# Patient Record
Sex: Male | Born: 1965 | Race: Black or African American | Hispanic: No | Marital: Married | Smoking: Former smoker
Health system: Southern US, Community
[De-identification: ages and names within clinical notes are randomized; demographics above are authoritative.]

## PROBLEM LIST (undated history)

## (undated) DIAGNOSIS — T07XXXA Unspecified multiple injuries, initial encounter: Secondary | ICD-10-CM

## (undated) DIAGNOSIS — K635 Polyp of colon: Secondary | ICD-10-CM

## (undated) HISTORY — DX: Polyp of colon: K63.5

## (undated) HISTORY — DX: Unspecified multiple injuries, initial encounter: T07.XXXA

## (undated) HISTORY — PX: POLYPECTOMY: SHX149

---

## 1998-08-30 ENCOUNTER — Encounter: Admission: RE | Admit: 1998-08-30 | Discharge: 1998-11-28 | Payer: Self-pay | Admitting: Family Medicine

## 1999-08-30 ENCOUNTER — Encounter (INDEPENDENT_AMBULATORY_CARE_PROVIDER_SITE_OTHER): Payer: Self-pay | Admitting: Specialist

## 1999-08-30 ENCOUNTER — Ambulatory Visit (HOSPITAL_COMMUNITY): Admission: RE | Admit: 1999-08-30 | Discharge: 1999-08-30 | Payer: Self-pay | Admitting: Gastroenterology

## 2000-01-02 ENCOUNTER — Emergency Department (HOSPITAL_COMMUNITY): Admission: EM | Admit: 2000-01-02 | Discharge: 2000-01-02 | Payer: Self-pay | Admitting: Emergency Medicine

## 2001-12-11 ENCOUNTER — Ambulatory Visit: Admission: RE | Admit: 2001-12-11 | Discharge: 2001-12-11 | Payer: Self-pay | Admitting: *Deleted

## 2009-03-30 ENCOUNTER — Encounter: Admission: RE | Admit: 2009-03-30 | Discharge: 2009-03-30 | Payer: Self-pay | Admitting: Family Medicine

## 2009-10-03 IMAGING — CR DG CERVICAL SPINE COMPLETE 4+V
6 series · 6 of 6 positions shown · non-contrast
Comparison: None available.

CLINICAL DATA: 42-year-old male with neck stiffness for 2 months.
No known trauma.

CERVICAL SPINE - COMPLETE 4+ VIEW

[view not recorded (1 of 6)]
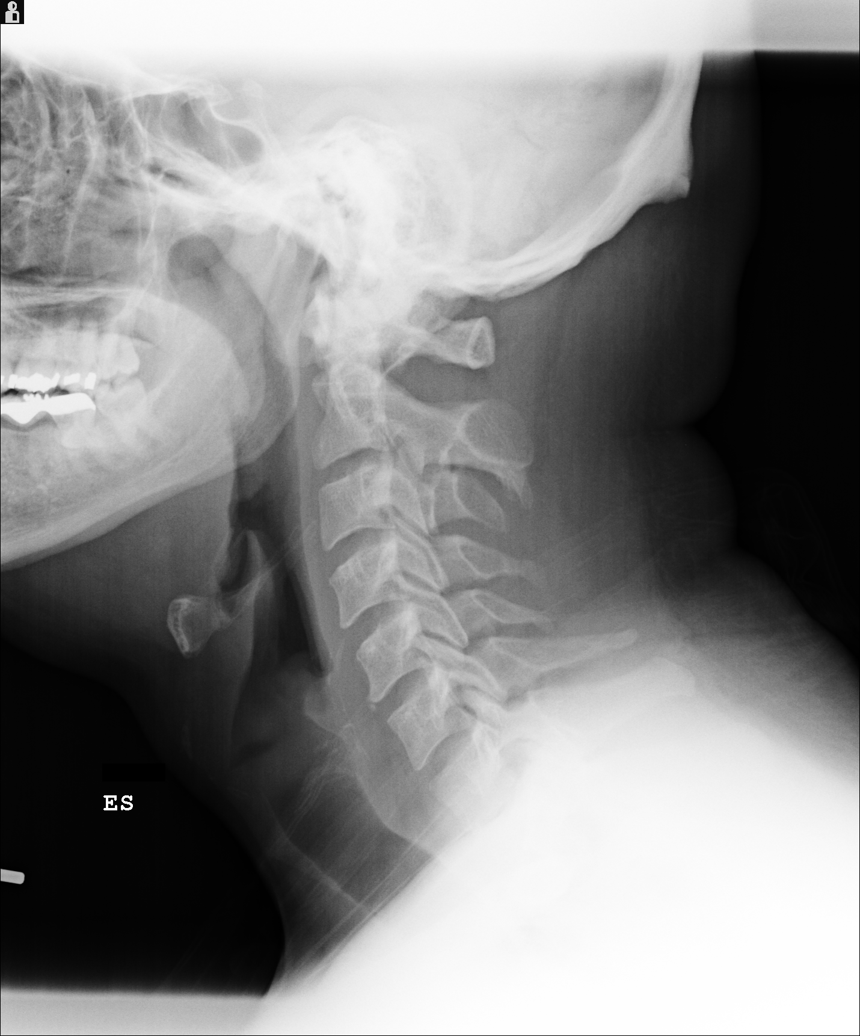

[view not recorded (2 of 6)]
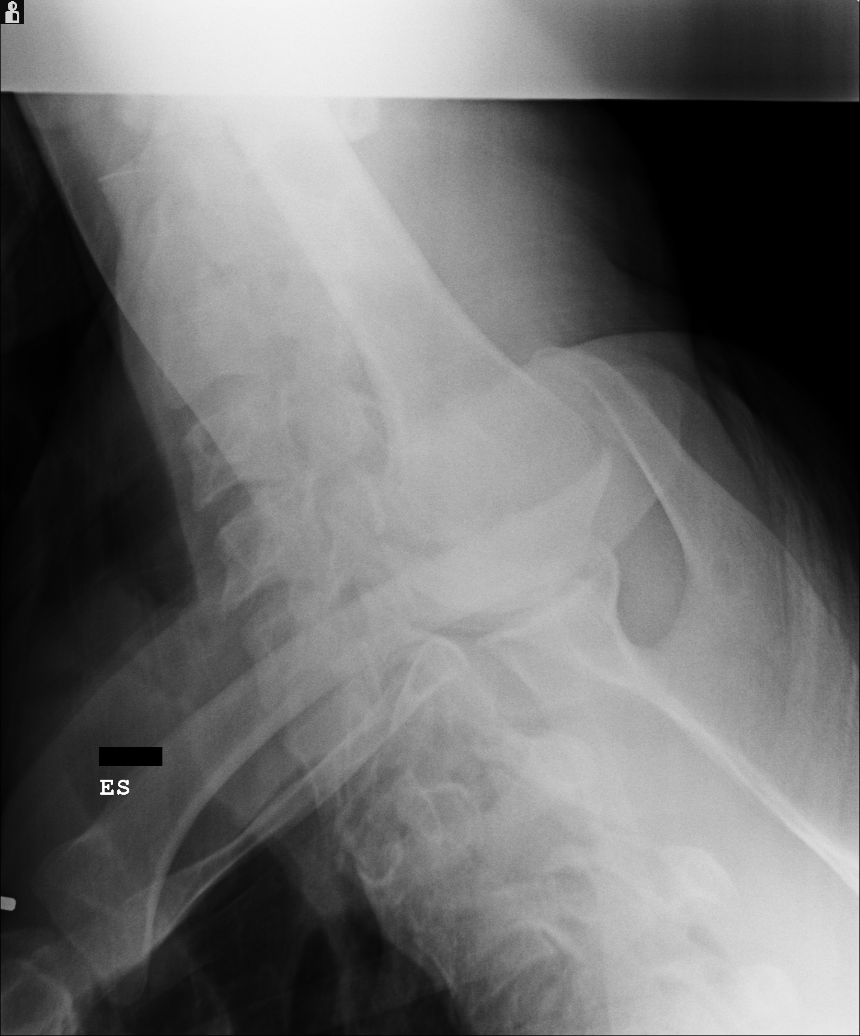

[view not recorded (3 of 6)]
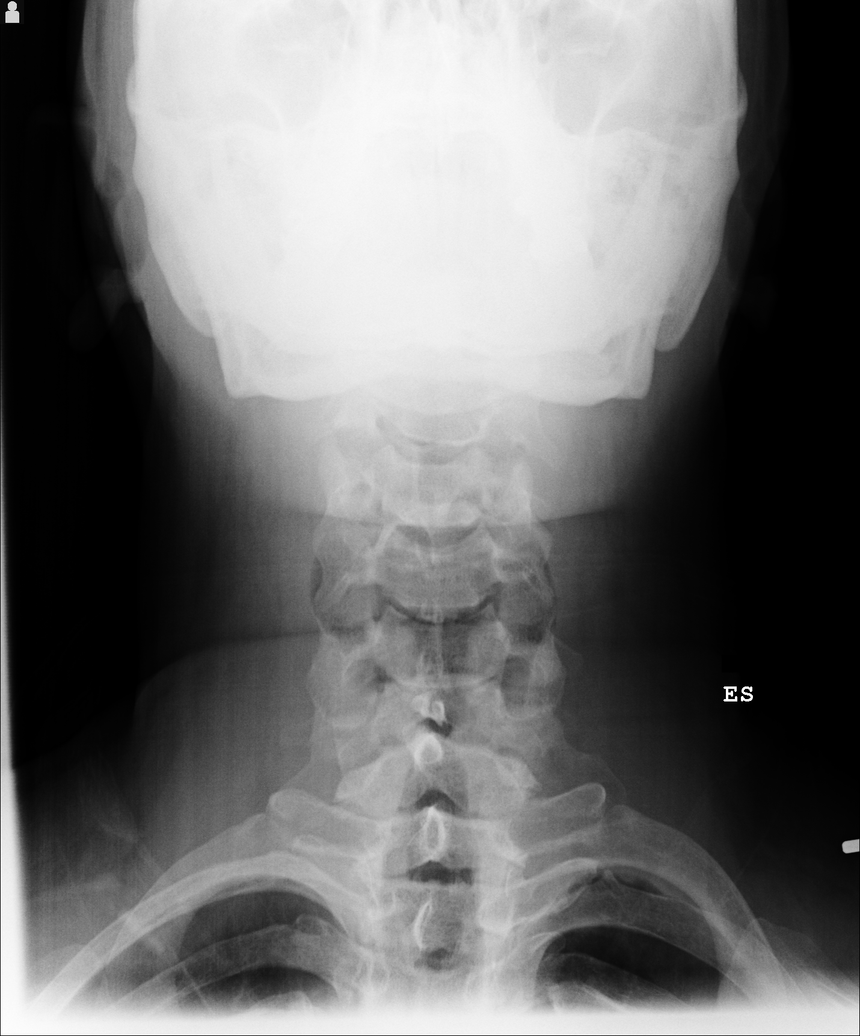

[view not recorded (4 of 6)]
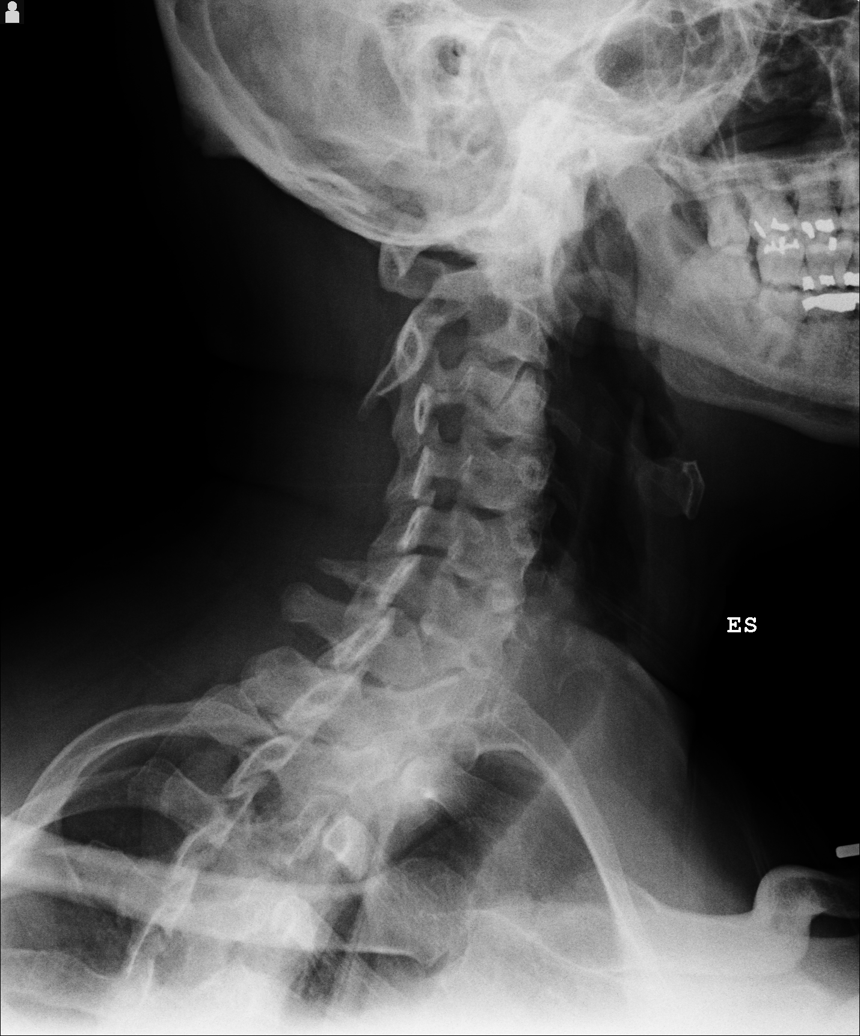

[view not recorded (5 of 6)]
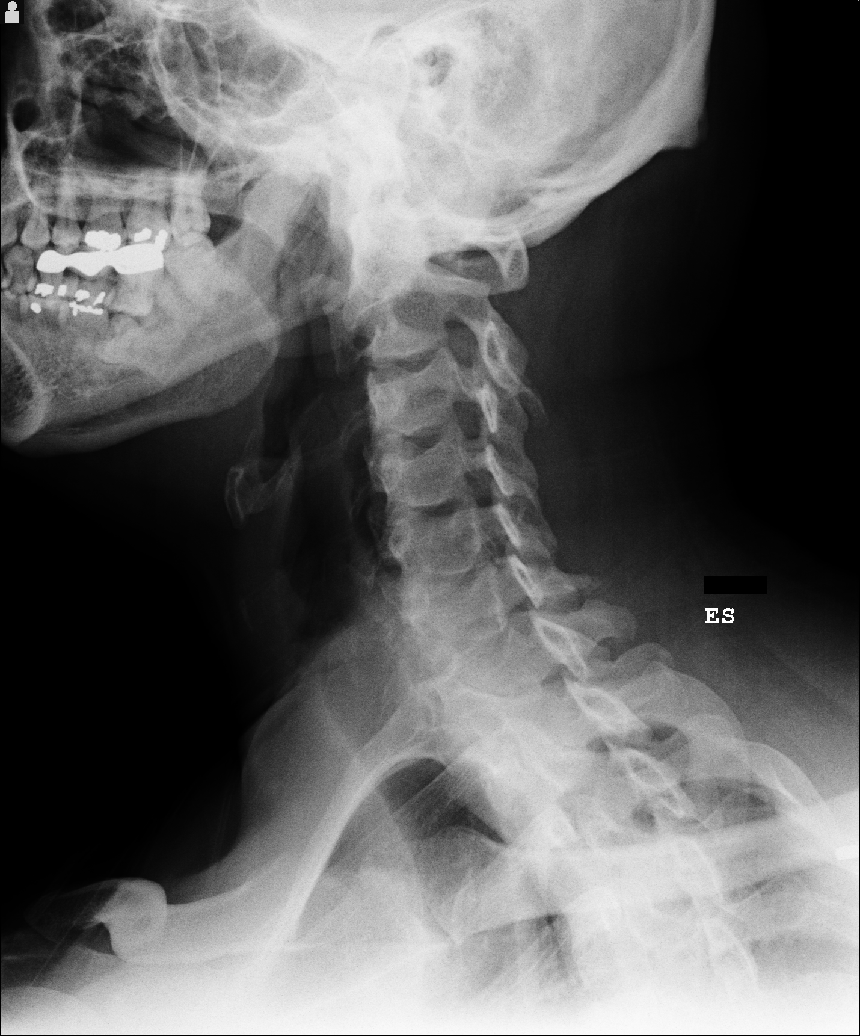

[view not recorded (6 of 6)]
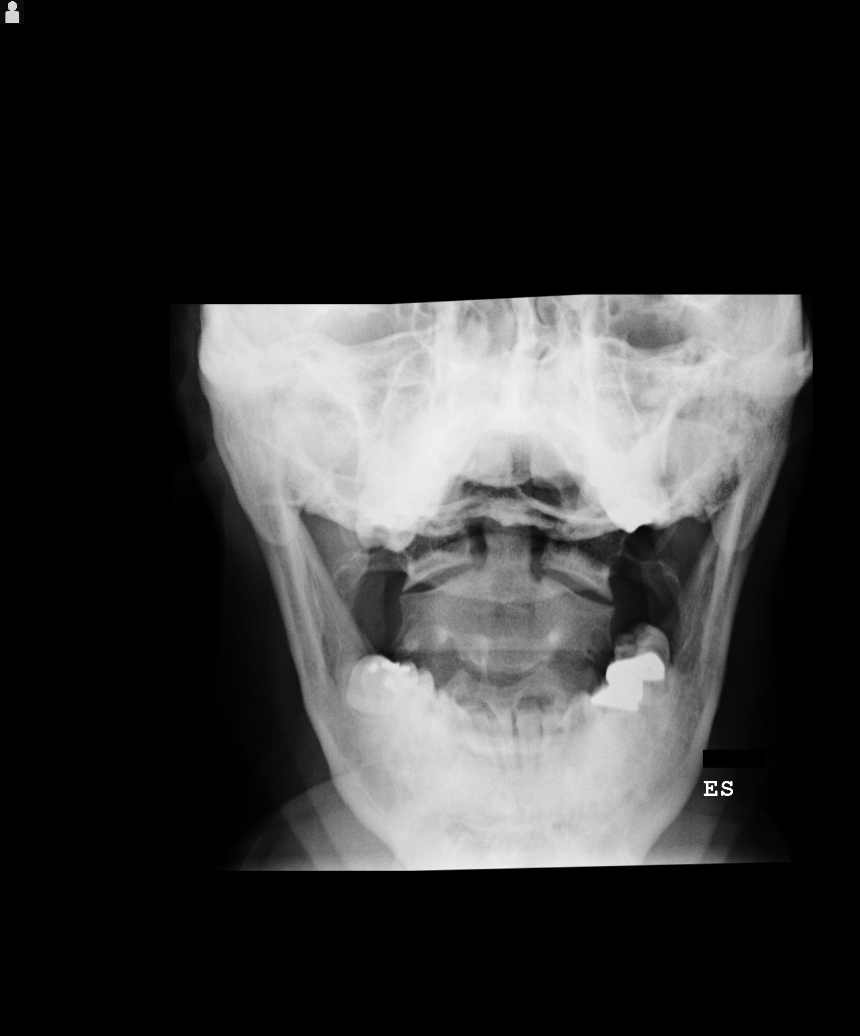

[6 of 6 positions shown; findings below may reference images not displayed]

FINDINGS: Preserved cervical lordosis.  Normal prevertebral soft
tissues. Cervicothoracic junction alignment is within normal
limits.  Relatively preserved disc spaces.  Small discontinuous
osteophyte anteriorly at C5-C6. Bilateral posterior element
alignment is within normal limits.  No significant osseous neural
foraminal stenosis.  AP alignment, C1-C2 alignment, and odontoid
process are within normal limits.
IMPRESSION: Negative radiographic appearance of the cervical spine for age.

## 2016-04-23 DIAGNOSIS — H40022 Open angle with borderline findings, high risk, left eye: Secondary | ICD-10-CM | POA: Diagnosis not present

## 2016-05-13 DIAGNOSIS — I351 Nonrheumatic aortic (valve) insufficiency: Secondary | ICD-10-CM | POA: Diagnosis not present

## 2016-05-13 DIAGNOSIS — I119 Hypertensive heart disease without heart failure: Secondary | ICD-10-CM | POA: Diagnosis not present

## 2016-05-30 DIAGNOSIS — N5201 Erectile dysfunction due to arterial insufficiency: Secondary | ICD-10-CM | POA: Diagnosis not present

## 2016-05-30 DIAGNOSIS — E291 Testicular hypofunction: Secondary | ICD-10-CM | POA: Diagnosis not present

## 2016-05-30 DIAGNOSIS — Z125 Encounter for screening for malignant neoplasm of prostate: Secondary | ICD-10-CM | POA: Diagnosis not present

## 2016-09-09 DIAGNOSIS — J069 Acute upper respiratory infection, unspecified: Secondary | ICD-10-CM | POA: Diagnosis not present

## 2017-01-22 DIAGNOSIS — J069 Acute upper respiratory infection, unspecified: Secondary | ICD-10-CM | POA: Diagnosis not present

## 2017-01-22 DIAGNOSIS — L309 Dermatitis, unspecified: Secondary | ICD-10-CM | POA: Diagnosis not present

## 2017-07-02 DIAGNOSIS — Z0000 Encounter for general adult medical examination without abnormal findings: Secondary | ICD-10-CM | POA: Diagnosis not present

## 2017-07-02 DIAGNOSIS — R739 Hyperglycemia, unspecified: Secondary | ICD-10-CM | POA: Diagnosis not present

## 2017-07-02 DIAGNOSIS — I10 Essential (primary) hypertension: Secondary | ICD-10-CM | POA: Diagnosis not present

## 2017-07-16 DIAGNOSIS — E291 Testicular hypofunction: Secondary | ICD-10-CM | POA: Diagnosis not present

## 2017-07-16 DIAGNOSIS — Z125 Encounter for screening for malignant neoplasm of prostate: Secondary | ICD-10-CM | POA: Diagnosis not present

## 2017-07-22 DIAGNOSIS — N5201 Erectile dysfunction due to arterial insufficiency: Secondary | ICD-10-CM | POA: Diagnosis not present

## 2017-07-22 DIAGNOSIS — E291 Testicular hypofunction: Secondary | ICD-10-CM | POA: Diagnosis not present

## 2017-10-05 DIAGNOSIS — R7989 Other specified abnormal findings of blood chemistry: Secondary | ICD-10-CM | POA: Diagnosis not present

## 2017-10-05 DIAGNOSIS — D229 Melanocytic nevi, unspecified: Secondary | ICD-10-CM | POA: Diagnosis not present

## 2017-10-05 DIAGNOSIS — Z6831 Body mass index (BMI) 31.0-31.9, adult: Secondary | ICD-10-CM | POA: Diagnosis not present

## 2017-10-05 DIAGNOSIS — E663 Overweight: Secondary | ICD-10-CM | POA: Diagnosis not present

## 2017-10-05 DIAGNOSIS — I10 Essential (primary) hypertension: Secondary | ICD-10-CM | POA: Diagnosis not present

## 2017-10-05 DIAGNOSIS — R011 Cardiac murmur, unspecified: Secondary | ICD-10-CM | POA: Diagnosis not present

## 2017-10-20 DIAGNOSIS — I351 Nonrheumatic aortic (valve) insufficiency: Secondary | ICD-10-CM | POA: Diagnosis not present

## 2017-10-20 DIAGNOSIS — I119 Hypertensive heart disease without heart failure: Secondary | ICD-10-CM | POA: Diagnosis not present

## 2017-12-01 ENCOUNTER — Telehealth: Payer: Self-pay | Admitting: *Deleted

## 2017-12-01 NOTE — Telephone Encounter (Signed)
LMVM  Home # for pt to cancel.  Will call back to r/s.

## 2017-12-02 ENCOUNTER — Ambulatory Visit: Payer: BLUE CROSS/BLUE SHIELD | Admitting: Neurology

## 2017-12-02 NOTE — Telephone Encounter (Signed)
Pt called back to r/s appt.  1st available is 3/28 and added to wait list. I did schedule this with him but the pt is very upset that he is having to wait again to be seen. I told him I would send a msg to Rn to try to get him in sooner.  FYI

## 2017-12-08 ENCOUNTER — Encounter: Payer: Self-pay | Admitting: *Deleted

## 2017-12-08 ENCOUNTER — Ambulatory Visit: Payer: BLUE CROSS/BLUE SHIELD | Admitting: Diagnostic Neuroimaging

## 2017-12-08 DIAGNOSIS — G500 Trigeminal neuralgia: Secondary | ICD-10-CM

## 2017-12-08 NOTE — Progress Notes (Signed)
[facility]  PATIENT: Tommy Doyle DOB: 10-22-1966  REFERRING CLINICIAN: Karna Dupes, MD HISTORY FROM: patient and chart review  REASON FOR VISIT: new consult    HISTORICAL  CHIEF COMPLAINT:  Chief Complaint  Patient presents with  . Trigeminal neuralgia    rm 7, New Pt, "bottom teeth aching with shooting pain in left lower jaw, dentist/ENT found no issues, moved to top of jaw;  has improved over time    HISTORY OF PRESENT ILLNESS:   52 year old male here for evaluation of left facial pain.  November 2018 patient had onset of intermittent sharp shooting electrical pain in his left lower jaw and neck region.  This lasted for a few seconds at a time.  This is typically triggered by hot liquids.  Over time this increased to lasting for a few minutes at a time.  He saw a dentist, endodontist, ENT, who found no specific pathology.  Patient tried ibuprofen and naproxen without relief.  By the end of December 2018 symptoms had gradually improved and resolved.  Currently patient has no residual symptoms.  No specific triggering or prodromal factors.  No similar symptoms in the past.  No numbness or tingling in his arms or legs.  No balance difficulty.  No swallowing or speech difficulty.  No vision changes.   REVIEW OF SYSTEMS: Full 14 system review of systems performed and negative with exception of: Sleepiness feeling cold history of hypertension and enlarged heart.  ALLERGIES: Allergies  Allergen Reactions  . Augmentin [Amoxicillin-Pot Clavulanate] Diarrhea    HOME MEDICATIONS: Outpatient Medications Prior to Visit  Medication Sig Dispense Refill  . amLODipine (NORVASC) 5 MG tablet Take 5 mg by mouth daily.    . carvedilol (COREG) 12.5 MG tablet Take 12.5 mg by mouth 2 (two) times daily with a meal.    . ibuprofen (ADVIL,MOTRIN) 200 MG tablet Take 200 mg by mouth every 6 (six) hours as needed.    Marland Kitchen losartan-hydrochlorothiazide (HYZAAR) 100-25 MG tablet Take  1 tablet by mouth daily.    . prednisoLONE acetate (PRED FORTE) 1 % ophthalmic suspension 1 drop 2 (two) times daily.    Marland Kitchen testosterone cypionate (DEPOTESTOSTERONE CYPIONATE) 200 MG/ML injection Inject 200 mg into the muscle every 14 (fourteen) days.    Marland Kitchen triamcinolone cream (KENALOG) 0.1 % Apply 1 application topically 2 (two) times daily.    . valACYclovir (VALTREX) 500 MG tablet Take 500 mg by mouth 2 (two) times daily. As needed    . naproxen (NAPROSYN) 500 MG tablet Take 500 mg by mouth 2 (two) times daily with a meal.    . sildenafil (REVATIO) 20 MG tablet Take 20 mg by mouth 3 (three) times daily.     No facility-administered medications prior to visit.     PAST MEDICAL HISTORY: Past Medical History:  Diagnosis Date  . Colon polyps   . Fractures    hx bilateral femur fx, age 31's    PAST SURGICAL HISTORY: Past Surgical History:  Procedure Laterality Date  . POLYPECTOMY      FAMILY HISTORY: Family History  Problem Relation Age of Onset  . Hypertension Mother   . Cancer Mother        gallbladder  . Stroke Mother   . Hypertension Father   . Diabetes Father   . Hypertension Brother     SOCIAL HISTORY:  Social History   Socioeconomic History  . Marital status: Married    Spouse name: Not on file  . Number  of children: 2  . Years of education: masters  . Highest education level: Not on file  Social Needs  . Financial resource strain: Not on file  . Food insecurity - worry: Not on file  . Food insecurity - inability: Not on file  . Transportation needs - medical: Not on file  . Transportation needs - non-medical: Not on file  Occupational History    Comment: engineering [employer]  Tobacco Use  . Smoking status: Former Games developermoker  . Smokeless tobacco: Never Used  . Tobacco comment: smoked  in college  Substance and Sexual Activity  . Alcohol use: Yes    Comment: 5 drinks/week  . Drug use: No  . Sexual activity: Not on file  Other Topics Concern  . Not on file    Social History Narrative   Lives with wife   Caffeine- coffee, 2 cups     PHYSICAL EXAM  GENERAL EXAM/CONSTITUTIONAL: Vitals:  Vitals:   12/08/17 1446  BP: (!) 148/83  Pulse: 74  Weight: 262 lb 6.4 oz (119 kg)  Height: 6' 4.5" (1.943 m)     Body mass index is 31.52 kg/m.  Visual Acuity Screening   Right eye Left eye Both eyes  Without correction: 20/20 20/20   With correction:        Patient is in no distress; well developed, nourished and groomed; neck is supple  CARDIOVASCULAR:  Examination of carotid arteries is normal; no carotid bruits  Regular rate and rhythm, no murmurs  Examination of peripheral vascular system by observation and palpation is normal  EYES:  Ophthalmoscopic exam of optic discs and posterior segments is normal; no papilledema or hemorrhages  MUSCULOSKELETAL:  Gait, strength, tone, movements noted in Neurologic exam below  NEUROLOGIC: MENTAL STATUS:  No flowsheet data found.  awake, alert, oriented to person, place and time  recent and remote memory intact  normal attention and concentration  language fluent, comprehension intact, naming intact,   fund of knowledge appropriate  CRANIAL NERVE:   2nd - no papilledema on fundoscopic exam  2nd, 3rd, 4th, 6th - pupils equal and reactive to light, visual fields full to confrontation, extraocular muscles intact, no nystagmus  5th - facial sensation symmetric  7th - facial strength symmetric  8th - hearing intact  9th - palate elevates symmetrically, uvula midline  11th - shoulder shrug symmetric  12th - tongue protrusion midline  MOTOR:   normal bulk and tone, full strength in the BUE, BLE  SENSORY:   normal and symmetric to light touch, temperature, vibration  COORDINATION:   finger-nose-finger, fine finger movements normal  REFLEXES:   deep tendon reflexes present and symmetric  GAIT/STATION:   narrow based gait    DIAGNOSTIC DATA (LABS, IMAGING,  TESTING) - I reviewed patient records, labs, notes, testing and imaging myself where available.  No results found for: WBC, HGB, HCT, MCV, PLT No results found for: NA, K, CL, CO2, GLUCOSE, BUN, CREATININE, CALCIUM, PROT, ALBUMIN, AST, ALT, ALKPHOS, BILITOT, GFRNONAA, GFRAA No results found for: CHOL, HDL, LDLCALC, LDLDIRECT, TRIG, CHOLHDL No results found for: WGNF6OHGBA1C No results found for: VITAMINB12 No results found for: TSH      ASSESSMENT AND PLAN  52 y.o. year old male here with intermittent sharp shooting pain in left trigeminal nerve distribution (V2, V3) in November and December 2018.  Symptoms spontaneously resolved.  Could represent idiopathic trigeminal neuralgia.  Will check MRI of the brain and trigeminal nerve protocol to rule out other secondary causes.  Dx:  1. Trigeminal neuralgia      PLAN:  Orders Placed This Encounter  Procedures  . MR FACE/TRIGEMINAL WO/W CM  . MR BRAIN W WO CONTRAST   Return in about 6 months (around 06/07/2018).    Suanne Marker, MD 12/08/2017, 3:04 PM Certified in Neurology, Neurophysiology and Neuroimaging  [facility] [address], [address] [place], [place] [address] [phone]

## 2017-12-08 NOTE — Patient Instructions (Signed)
-   check MRI brain   - monitor symptoms 

## 2018-01-28 ENCOUNTER — Ambulatory Visit: Payer: Self-pay | Admitting: Neurology

## 2018-05-20 ENCOUNTER — Other Ambulatory Visit: Payer: Self-pay | Admitting: Gastroenterology

## 2018-05-20 DIAGNOSIS — Z800 Family history of malignant neoplasm of digestive organs: Secondary | ICD-10-CM

## 2018-05-20 DIAGNOSIS — Z1211 Encounter for screening for malignant neoplasm of colon: Secondary | ICD-10-CM | POA: Diagnosis not present

## 2018-05-20 DIAGNOSIS — Z01818 Encounter for other preprocedural examination: Secondary | ICD-10-CM | POA: Diagnosis not present

## 2018-06-01 ENCOUNTER — Ambulatory Visit
Admission: RE | Admit: 2018-06-01 | Discharge: 2018-06-01 | Disposition: A | Discharge status: Home / Self Care | Payer: BLUE CROSS/BLUE SHIELD | Source: Ambulatory Visit | Attending: Gastroenterology | Admitting: Gastroenterology

## 2018-06-01 DIAGNOSIS — Z800 Family history of malignant neoplasm of digestive organs: Secondary | ICD-10-CM

## 2018-06-08 ENCOUNTER — Ambulatory Visit: Payer: BLUE CROSS/BLUE SHIELD | Admitting: Diagnostic Neuroimaging

## 2018-06-08 ENCOUNTER — Telehealth: Payer: Self-pay | Admitting: *Deleted

## 2018-06-08 NOTE — Telephone Encounter (Signed)
Patient was no show for follow up today. 

## 2018-06-09 ENCOUNTER — Encounter: Payer: Self-pay | Admitting: Diagnostic Neuroimaging

## 2018-09-03 DIAGNOSIS — K5730 Diverticulosis of large intestine without perforation or abscess without bleeding: Secondary | ICD-10-CM | POA: Diagnosis not present

## 2018-09-03 DIAGNOSIS — Z1211 Encounter for screening for malignant neoplasm of colon: Secondary | ICD-10-CM | POA: Diagnosis not present

## 2018-09-03 DIAGNOSIS — K635 Polyp of colon: Secondary | ICD-10-CM | POA: Diagnosis not present

## 2018-09-03 DIAGNOSIS — K648 Other hemorrhoids: Secondary | ICD-10-CM | POA: Diagnosis not present

## 2018-09-07 DIAGNOSIS — Z1211 Encounter for screening for malignant neoplasm of colon: Secondary | ICD-10-CM | POA: Diagnosis not present

## 2018-09-07 DIAGNOSIS — K635 Polyp of colon: Secondary | ICD-10-CM | POA: Diagnosis not present

## 2018-09-17 DIAGNOSIS — R739 Hyperglycemia, unspecified: Secondary | ICD-10-CM | POA: Diagnosis not present

## 2018-09-17 DIAGNOSIS — E663 Overweight: Secondary | ICD-10-CM | POA: Diagnosis not present

## 2018-09-17 DIAGNOSIS — N5201 Erectile dysfunction due to arterial insufficiency: Secondary | ICD-10-CM | POA: Diagnosis not present

## 2018-09-17 DIAGNOSIS — I10 Essential (primary) hypertension: Secondary | ICD-10-CM | POA: Diagnosis not present

## 2018-09-17 DIAGNOSIS — D229 Melanocytic nevi, unspecified: Secondary | ICD-10-CM | POA: Diagnosis not present

## 2018-09-17 DIAGNOSIS — R7989 Other specified abnormal findings of blood chemistry: Secondary | ICD-10-CM | POA: Diagnosis not present

## 2018-09-17 DIAGNOSIS — R011 Cardiac murmur, unspecified: Secondary | ICD-10-CM | POA: Diagnosis not present

## 2018-09-17 DIAGNOSIS — Z125 Encounter for screening for malignant neoplasm of prostate: Secondary | ICD-10-CM | POA: Diagnosis not present

## 2018-09-22 DIAGNOSIS — N5201 Erectile dysfunction due to arterial insufficiency: Secondary | ICD-10-CM | POA: Diagnosis not present

## 2018-09-22 DIAGNOSIS — E291 Testicular hypofunction: Secondary | ICD-10-CM | POA: Diagnosis not present

## 2018-10-19 DIAGNOSIS — I351 Nonrheumatic aortic (valve) insufficiency: Secondary | ICD-10-CM | POA: Diagnosis not present

## 2018-10-19 DIAGNOSIS — I119 Hypertensive heart disease without heart failure: Secondary | ICD-10-CM | POA: Diagnosis not present

## 2018-10-19 DIAGNOSIS — E7800 Pure hypercholesterolemia, unspecified: Secondary | ICD-10-CM | POA: Diagnosis not present

## 2018-10-19 DIAGNOSIS — E669 Obesity, unspecified: Secondary | ICD-10-CM | POA: Diagnosis not present

## 2018-11-26 DIAGNOSIS — I351 Nonrheumatic aortic (valve) insufficiency: Secondary | ICD-10-CM | POA: Diagnosis not present

## 2018-12-21 DIAGNOSIS — D229 Melanocytic nevi, unspecified: Secondary | ICD-10-CM | POA: Diagnosis not present

## 2018-12-21 DIAGNOSIS — R739 Hyperglycemia, unspecified: Secondary | ICD-10-CM | POA: Diagnosis not present

## 2018-12-21 DIAGNOSIS — I10 Essential (primary) hypertension: Secondary | ICD-10-CM | POA: Diagnosis not present

## 2018-12-21 DIAGNOSIS — E663 Overweight: Secondary | ICD-10-CM | POA: Diagnosis not present

## 2018-12-21 DIAGNOSIS — R011 Cardiac murmur, unspecified: Secondary | ICD-10-CM | POA: Diagnosis not present

## 2019-04-21 ENCOUNTER — Other Ambulatory Visit: Payer: Self-pay | Admitting: Cardiology

## 2019-06-01 ENCOUNTER — Other Ambulatory Visit: Payer: Self-pay

## 2019-06-01 MED ORDER — SPIRONOLACTONE 25 MG PO TABS: 25.0000 mg | ORAL_TABLET | Freq: Every day | ORAL | 1 refills | Status: AC

## 2019-10-14 DIAGNOSIS — R195 Other fecal abnormalities: Secondary | ICD-10-CM | POA: Diagnosis not present

## 2019-10-17 ENCOUNTER — Ambulatory Visit: Payer: BLUE CROSS/BLUE SHIELD | Admitting: Cardiology

## 2019-11-08 DIAGNOSIS — R739 Hyperglycemia, unspecified: Secondary | ICD-10-CM | POA: Diagnosis not present

## 2019-11-08 DIAGNOSIS — R7989 Other specified abnormal findings of blood chemistry: Secondary | ICD-10-CM | POA: Diagnosis not present

## 2019-11-08 DIAGNOSIS — I10 Essential (primary) hypertension: Secondary | ICD-10-CM | POA: Diagnosis not present

## 2019-11-08 DIAGNOSIS — E663 Overweight: Secondary | ICD-10-CM | POA: Diagnosis not present

## 2019-11-18 DIAGNOSIS — R198 Other specified symptoms and signs involving the digestive system and abdomen: Secondary | ICD-10-CM | POA: Diagnosis not present

## 2019-11-18 DIAGNOSIS — R140 Abdominal distension (gaseous): Secondary | ICD-10-CM | POA: Diagnosis not present

## 2019-12-16 DIAGNOSIS — Z125 Encounter for screening for malignant neoplasm of prostate: Secondary | ICD-10-CM | POA: Diagnosis not present

## 2019-12-16 DIAGNOSIS — E291 Testicular hypofunction: Secondary | ICD-10-CM | POA: Diagnosis not present

## 2019-12-16 DIAGNOSIS — N5201 Erectile dysfunction due to arterial insufficiency: Secondary | ICD-10-CM | POA: Diagnosis not present

## 2020-05-28 IMAGING — US US ABDOMEN LIMITED
1 series · 14 of 25 positions shown · non-contrast
Comparison: None.

CLINICAL DATA: Family history of gallbladder malignancy.

EXAM:
ULTRASOUND ABDOMEN LIMITED RIGHT UPPER QUADRANT

[Series 1: us abdomen limited · 0.28mm/px · 14 of 41 slices shown]
[im 1/41]
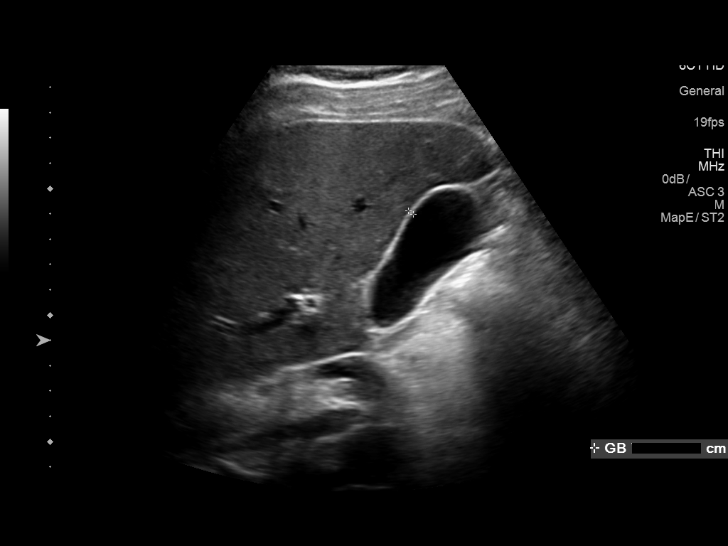
[im 4/41]
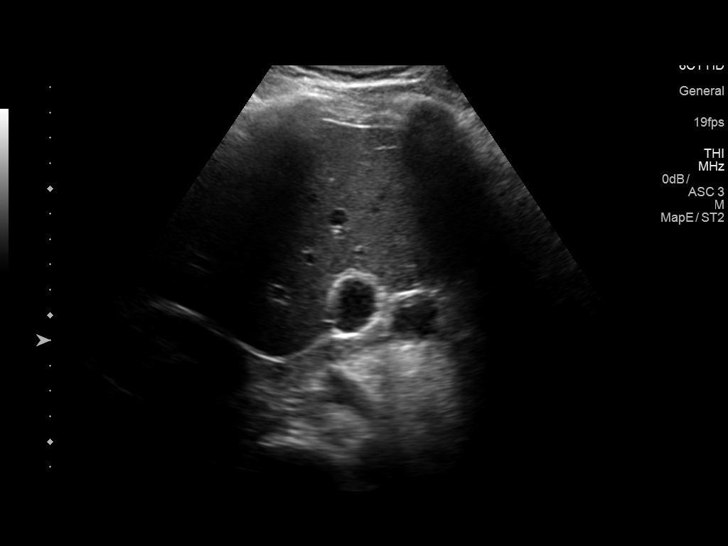
[im 7/41]
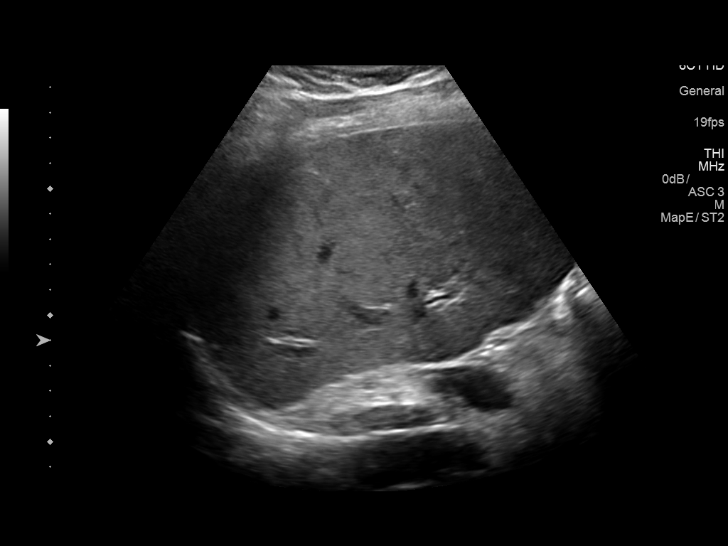
[im 11/41]
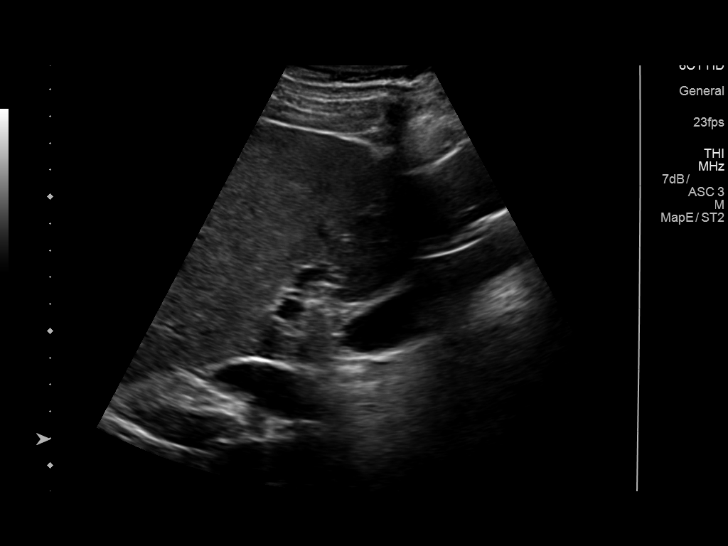
[im 14/41]
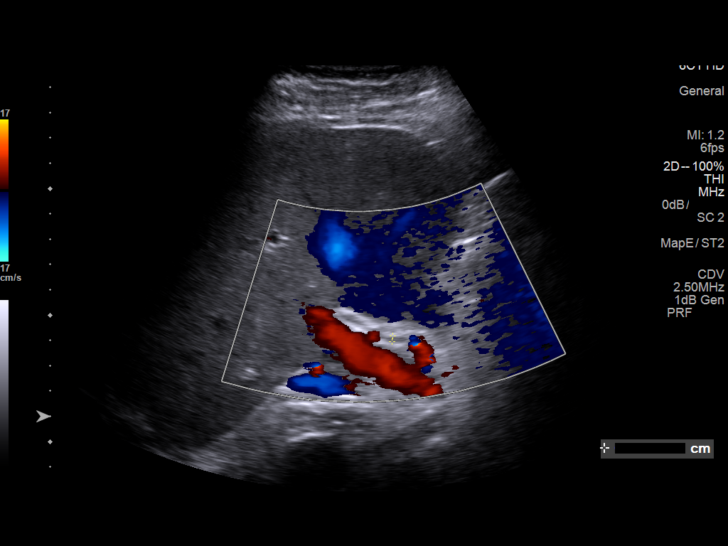
[im 16/41]
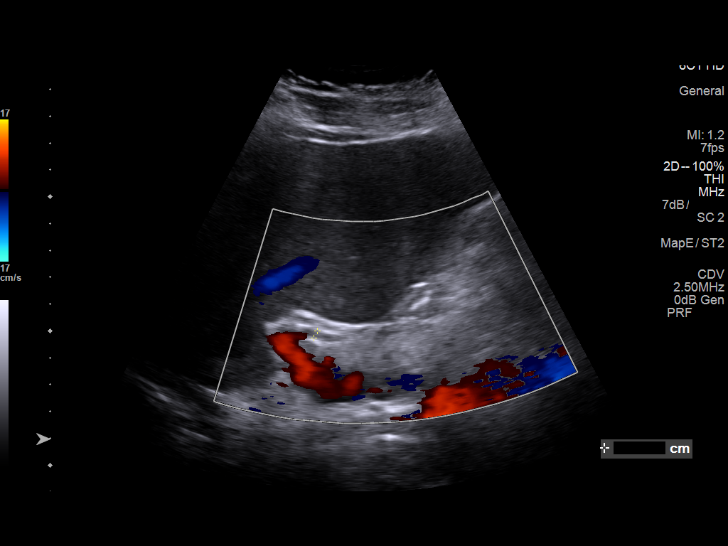
[im 19/41]
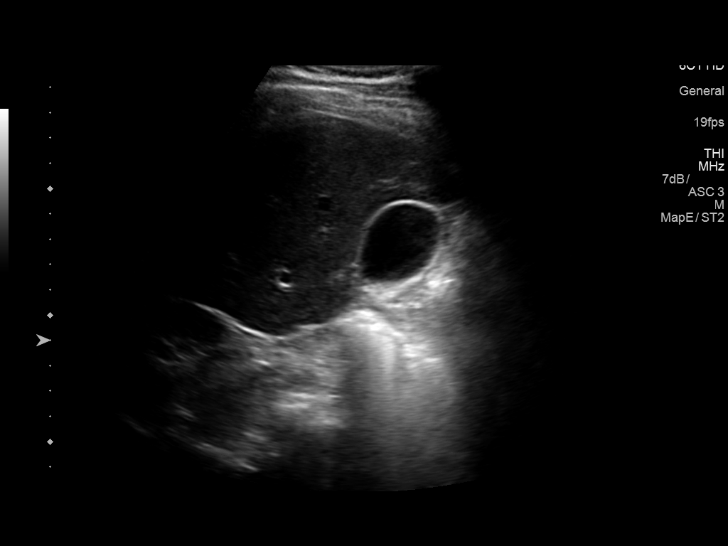
[im 22/41]
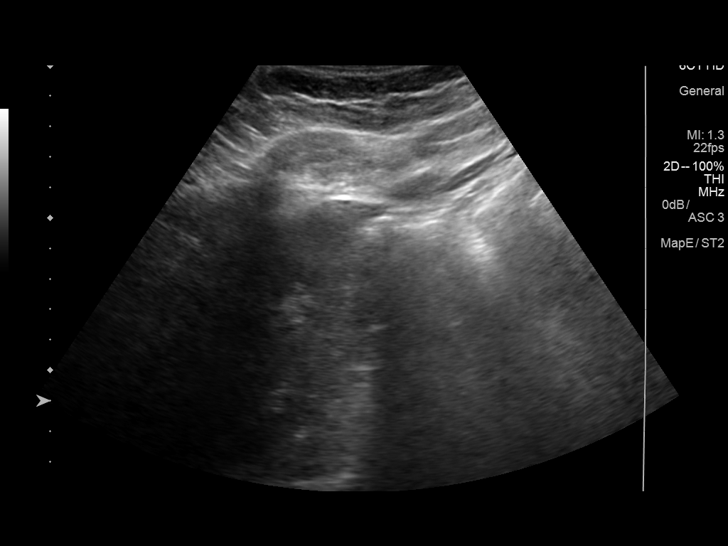
[im 26/41]
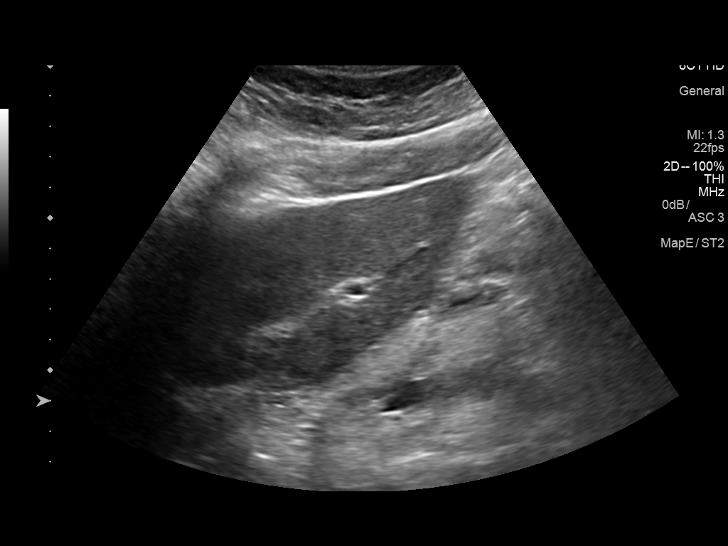
[im 27/41]
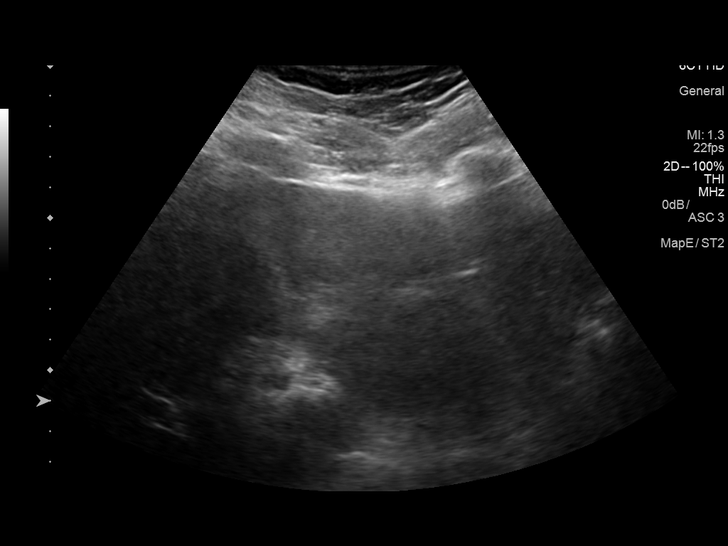
[im 31/41]
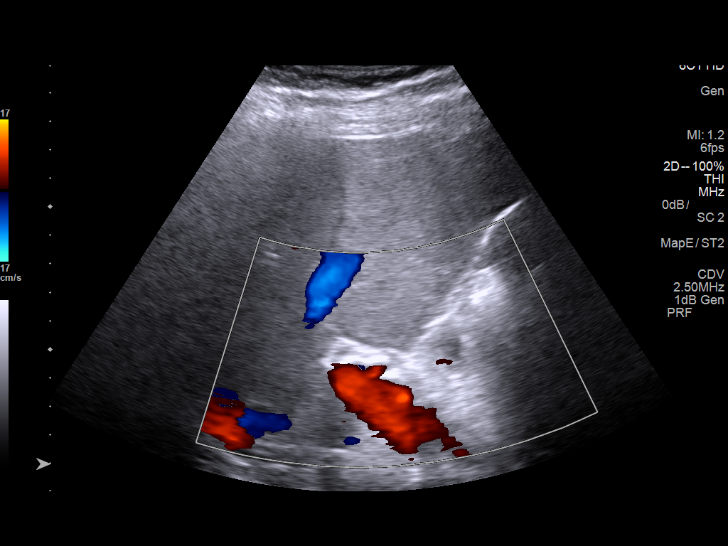
[im 34/41]
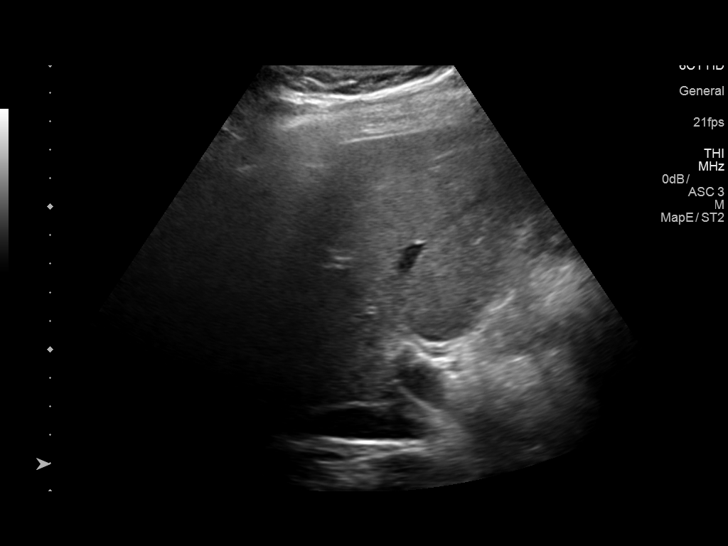
[im 37/41]
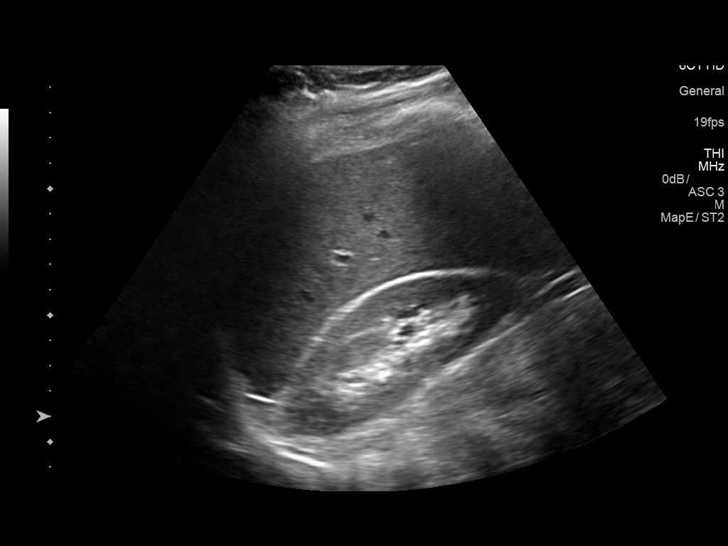
[im 41/41]
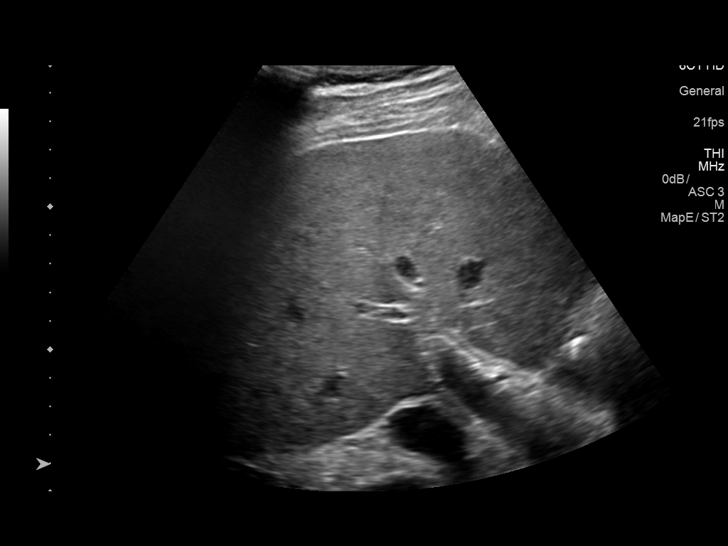

[14 of 25 positions shown; findings below may reference images not displayed]

FINDINGS: Gallbladder:

The gallbladder is adequately distended. No stones, wall thickening,
or pericholecystic fluid are observed. There is no positive
sonographic Murphy's sign.

Common bile duct:

Diameter: 3 mm

Liver:

The hepatic echotexture is increased diffusely. The surface contour
is smooth. There is no focal mass or ductal dilation. Portal vein is
patent on color Doppler imaging with normal direction of blood flow
towards the liver.
IMPRESSION: Normal appearing gallbladder.

Normal appearance of the common bile duct. Increased hepatic
echotexture is most compatible with fatty infiltrative changes.
